# Patient Record
Sex: Female | Born: 1955 | Race: White | Hispanic: No | Marital: Married | Smoking: Current every day smoker
Health system: Southern US, Community
[De-identification: ages and names within clinical notes are randomized; demographics above are authoritative.]

## PROBLEM LIST (undated history)

## (undated) DIAGNOSIS — F419 Anxiety disorder, unspecified: Secondary | ICD-10-CM

## (undated) DIAGNOSIS — I82409 Acute embolism and thrombosis of unspecified deep veins of unspecified lower extremity: Secondary | ICD-10-CM

## (undated) DIAGNOSIS — D696 Thrombocytopenia, unspecified: Secondary | ICD-10-CM

## (undated) DIAGNOSIS — G610 Guillain-Barre syndrome: Secondary | ICD-10-CM

## (undated) HISTORY — PX: TONSILLECTOMY: SUR1361

## (undated) HISTORY — PX: APPENDECTOMY: SHX54

## (undated) HISTORY — PX: PARTIAL HIP ARTHROPLASTY: SHX733

---

## 2004-08-26 ENCOUNTER — Inpatient Hospital Stay (HOSPITAL_COMMUNITY): Admission: RE | Admit: 2004-08-26 | Discharge: 2004-08-29 | Payer: Self-pay | Admitting: Orthopedic Surgery

## 2005-12-01 IMAGING — CR DG HIP COMPLETE 2+V*R*
3 series · 3 of 3 positions shown · non-contrast
Comparison: Prior MRI of the right hip from 07/16/04.

CLINICAL DATA: Right hip pain.  
 RIGHT HIP ? 3 VIEWS:

[t pelvis a.p.]
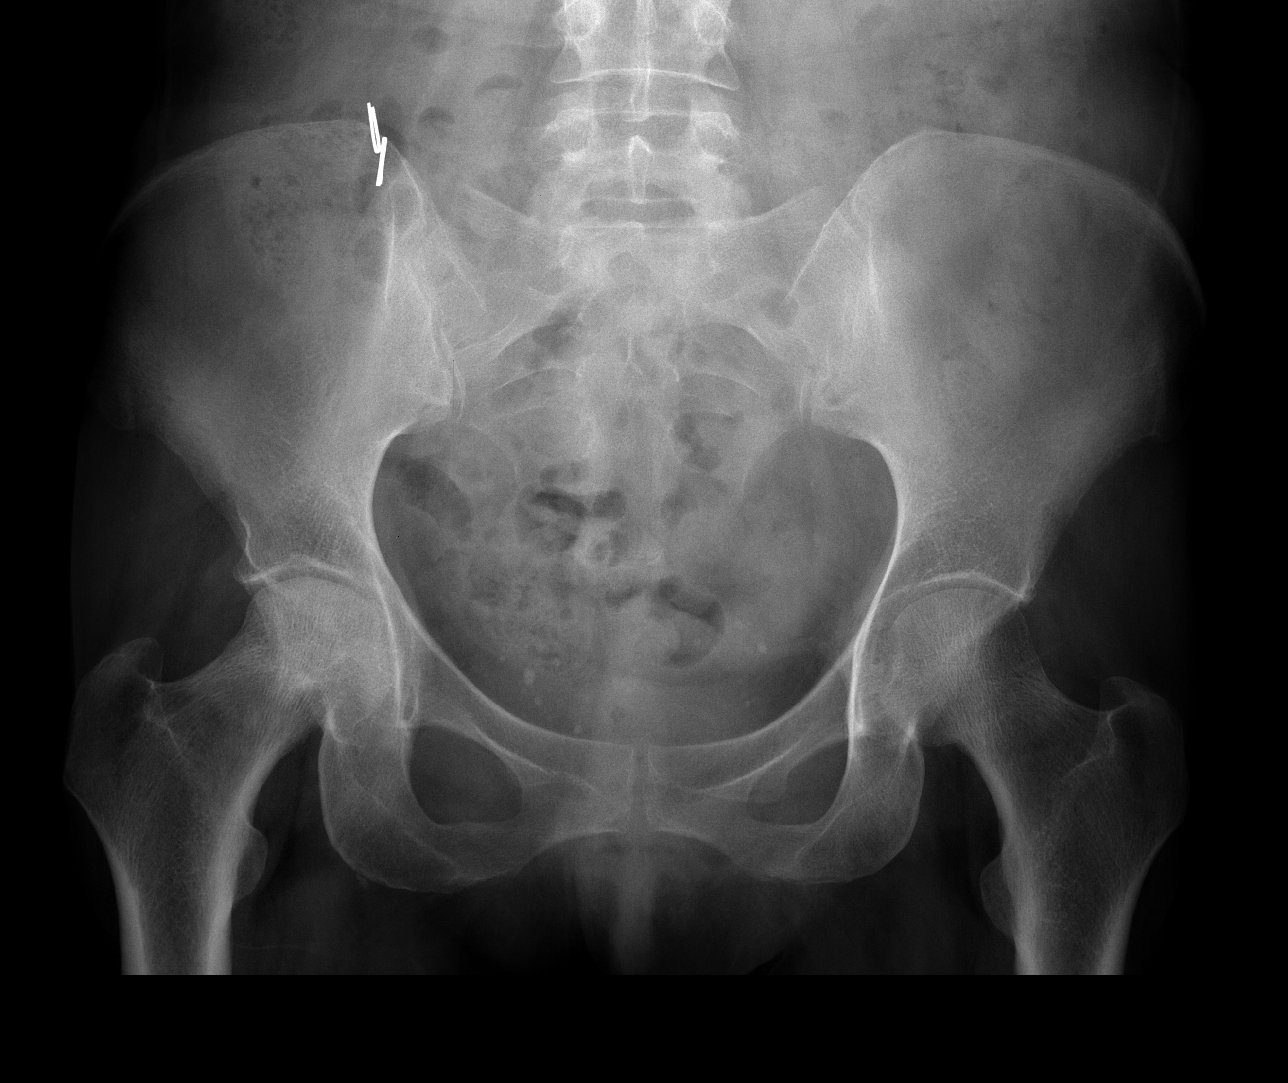

[t hip ap right]
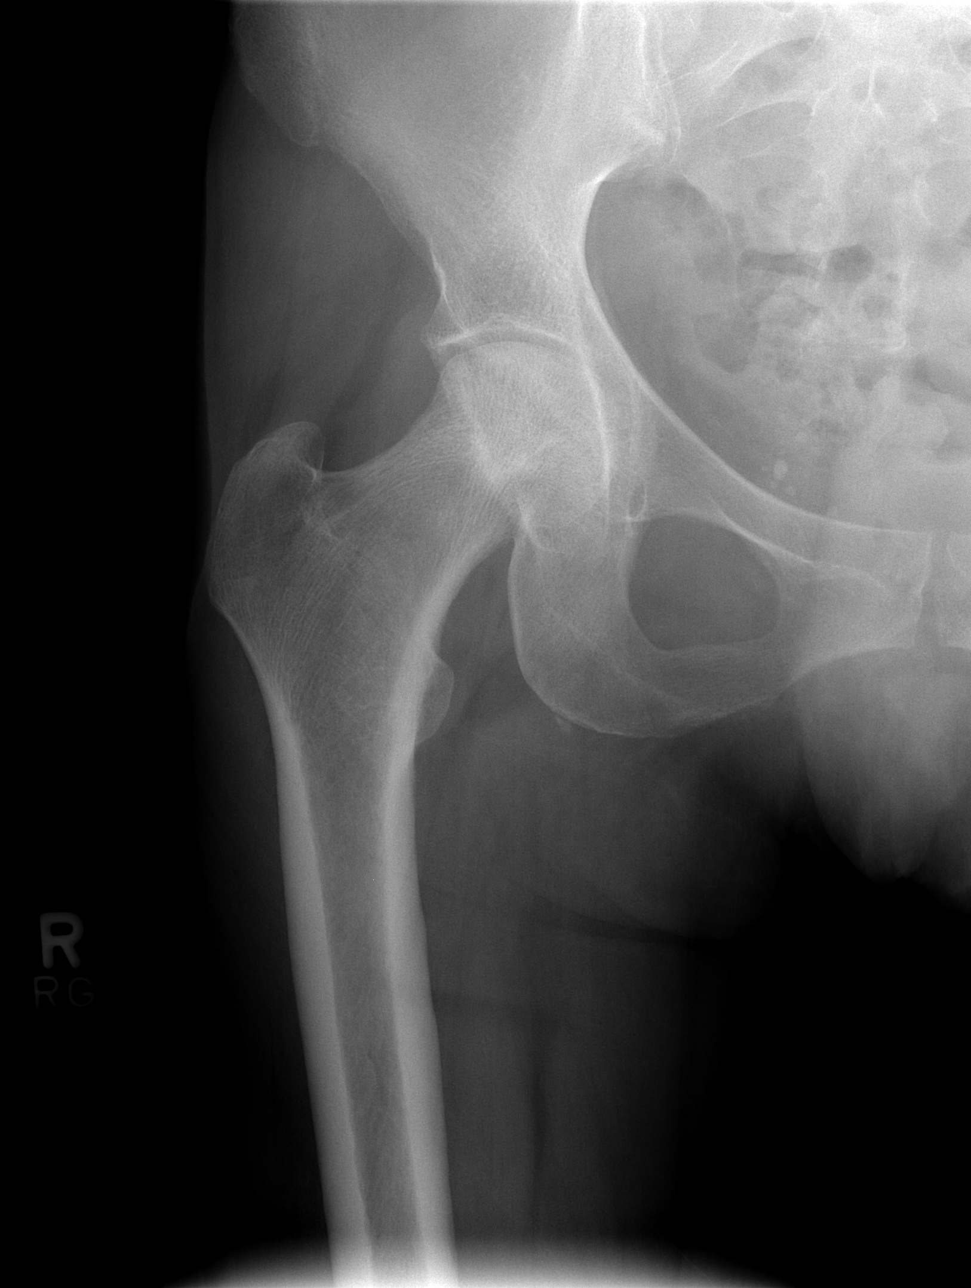

[t hip frog leg right]
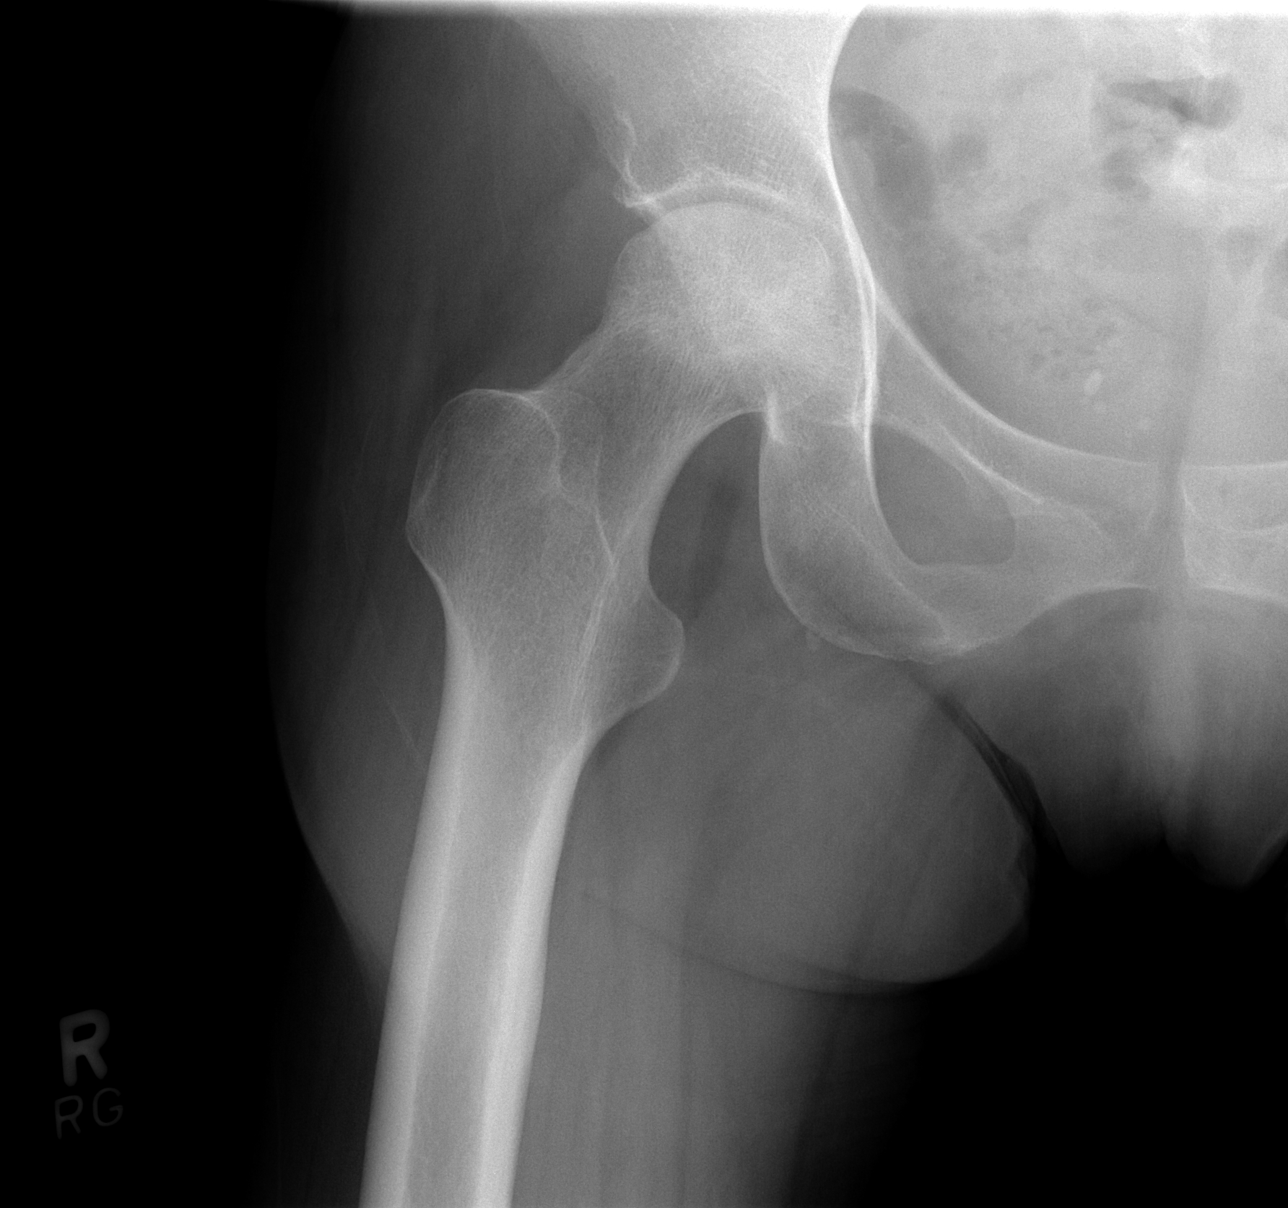

[3 of 3 positions shown; findings below may reference images not displayed]

FINDINGS: The patient has known avascular necrosis based on the recent MRI.  On radiographs today, this manifest only as sclerosis in the right femoral head and neck.  The left-sided avascular necrosis is currently relatively occult.  There is some coarsening of trabeculation in the right femoral head and neck additionally.  There is very slight contour irregularity of the right femoral head, which could be a manifestation of early flattening.
IMPRESSION: Known bilateral avascular necrosis, with radiographic manifestations only on the right side, consisting of sclerosis and trabecular coarsening and potentially very mild flattening.

## 2005-12-05 IMAGING — CR DG PORTABLE PELVIS
1 series · 1 of 1 positions shown · non-contrast
Comparison: none

CLINICAL DATA: Avascular necrosis.  
 PORTABLE PELVIS - 08/26/2004 AT 1977 HOURS:

[view not recorded]
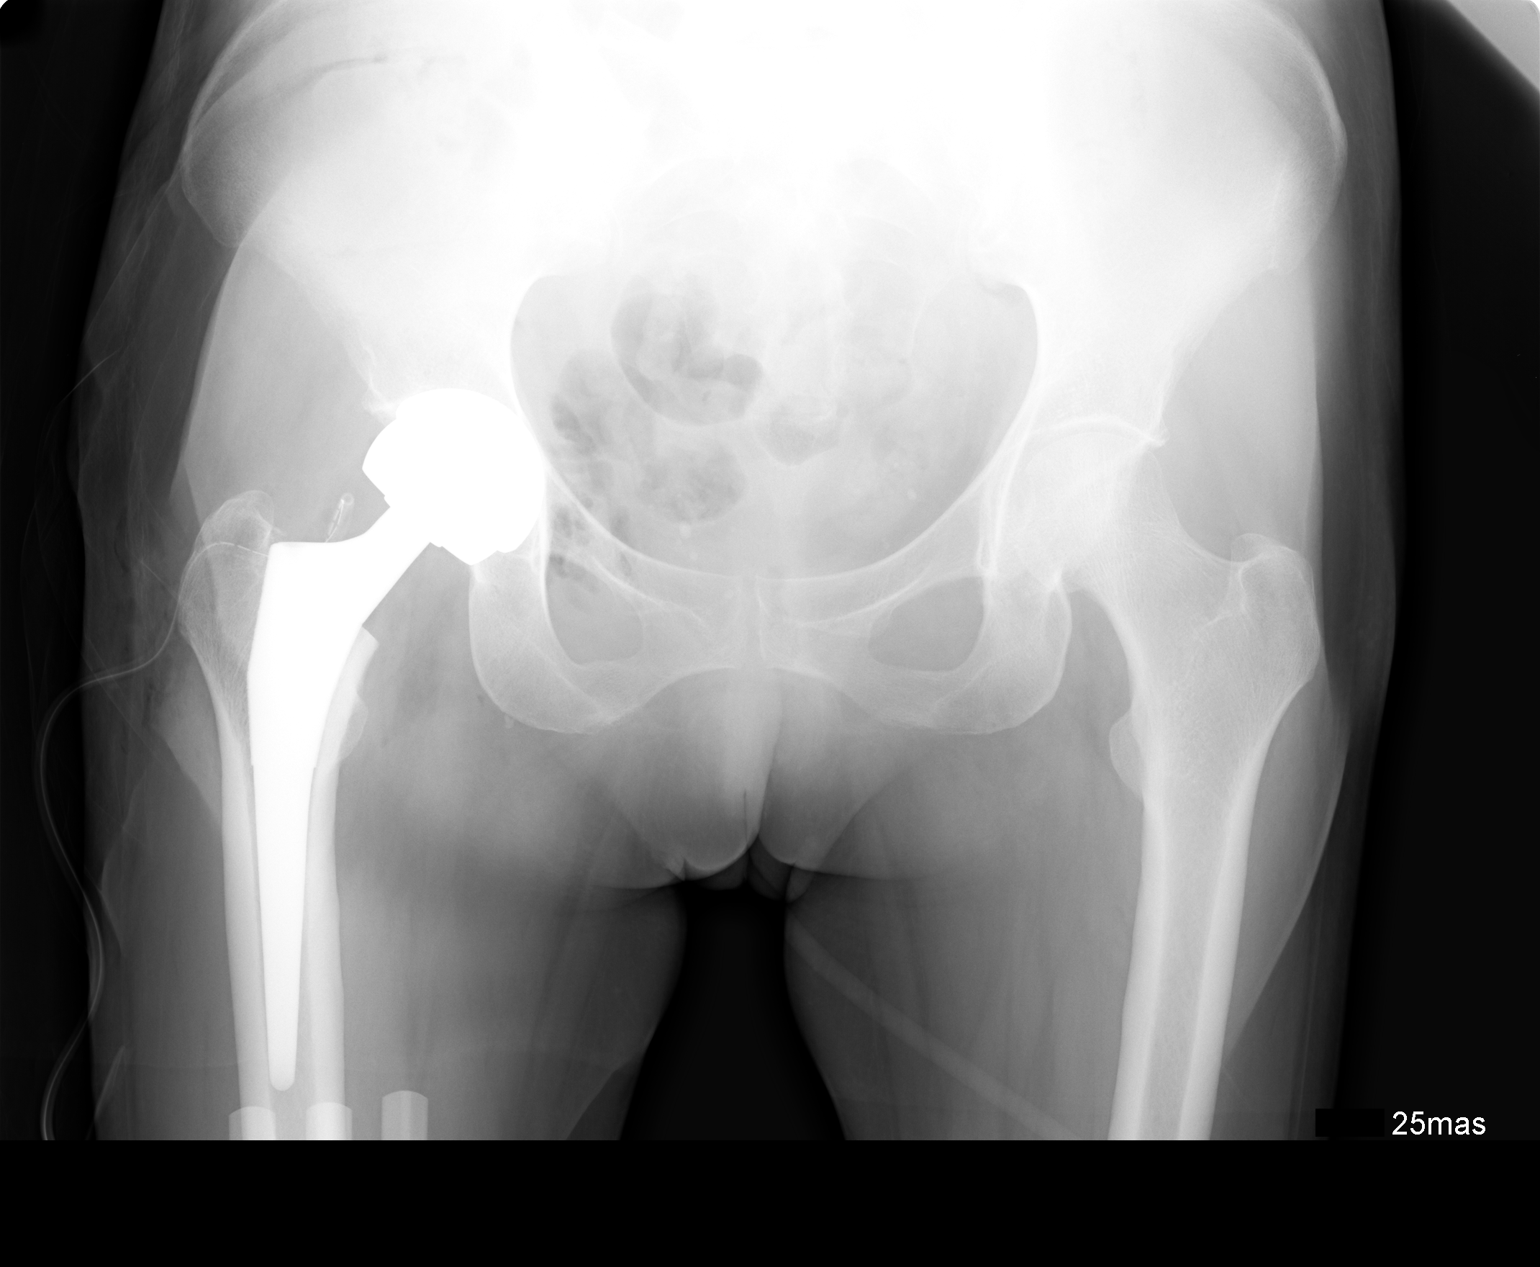

[1 of 1 positions shown; findings below may reference images not displayed]

FINDINGS: A right total hip arthroplasty is present.  There is anatomic alignment of the osseous and prosthetic structures based on one view.  A surgical drain projects over the right femoral neck. No acute fractures or dislocations are seen.
IMPRESSION: Right hip total arthroplasty without complication.

## 2015-06-27 ENCOUNTER — Telehealth: Payer: Self-pay | Admitting: *Deleted

## 2015-06-27 ENCOUNTER — Encounter: Payer: Self-pay | Admitting: *Deleted

## 2015-06-27 ENCOUNTER — Emergency Department (INDEPENDENT_AMBULATORY_CARE_PROVIDER_SITE_OTHER)
Admission: EM | Admit: 2015-06-27 | Discharge: 2015-06-27 | Disposition: A | Discharge status: Home / Self Care | Payer: Medicare HMO | Source: Home / Self Care | Attending: Family Medicine | Admitting: Family Medicine

## 2015-06-27 DIAGNOSIS — K029 Dental caries, unspecified: Secondary | ICD-10-CM | POA: Diagnosis not present

## 2015-06-27 HISTORY — DX: Guillain-Barre syndrome: G61.0

## 2015-06-27 HISTORY — DX: Anxiety disorder, unspecified: F41.9

## 2015-06-27 HISTORY — DX: Thrombocytopenia, unspecified: D69.6

## 2015-06-27 HISTORY — DX: Acute embolism and thrombosis of unspecified deep veins of unspecified lower extremity: I82.409

## 2015-06-27 MED ORDER — HYDROCODONE-ACETAMINOPHEN 5-325 MG PO TABS
1.0000 | ORAL_TABLET | Freq: Four times a day (QID) | ORAL | Status: AC | PRN
Start: 2015-06-27 — End: ?

## 2015-06-27 NOTE — Discharge Instructions (Signed)
Norco/Vicodin (hydrocodone-acetaminophen) is a narcotic pain medication, do not combine these medications with others containing tylenol. While taking, do not drink alcohol, drive, or perform any other activities that requires focus while taking these medications. Do not take while also taking your ativan.  Please be sure to follow up with your dentist tomorrow for further evaluation and treatment of dental pain.   Dental Caries Dental caries is tooth decay. This decay can cause a hole in teeth (cavity) that can get bigger and deeper over time. HOME CARE  Brush and floss your teeth. Do this at least two times a day.  Use a fluoride toothpaste.  Use a mouth rinse if told by your dentist or doctor.  Eat less sugary and starchy foods. Drink less sugary drinks.  Avoid snacking often on sugary and starchy foods. Avoid sipping often on sugary drinks.  Keep regular checkups and cleanings with your dentist.  Use fluoride supplements if told by your dentist or doctor.  Allow fluoride to be applied to teeth if told by your dentist or doctor.   This information is not intended to replace advice given to you by your health care provider. Make sure you discuss any questions you have with your health care provider.   Document Released: 10/15/2007 Document Revised: 01/26/2014 Document Reviewed: 01/08/2012 Elsevier Interactive Patient Education [employer]2016 [employer]

## 2015-06-27 NOTE — ED Notes (Addendum)
Pt [place] apt [place] this [place] to have several teeth pulled. 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 She was unable to get out of the truck and dentist could not help her. They told her to go somewhere for pain medication until she's able to come with someone who can physically bring her in. She has been on Amox for 2 days. We attempted to get dentist office to write RX and put in call to PCP without resolve. Reports tramadol does not work for her

## 2015-06-27 NOTE — ED Notes (Signed)
Callback: Spoke with patient's husband, following up to ensure the numbing in her mouth was wearing off. He reported she was sleeping. Call back with any questions or concerns.

## 2015-06-27 NOTE — ED Provider Notes (Signed)
CSN: [id number]     Arrival date & time 06/27/15  1005 History   First MD Initiated Contact with Patient 06/27/15 1015     Chief Complaint  Patient presents with  . Dental Pain   (Consider location/radiation/quality/duration/timing/severity/associated sxs/prior Treatment) HPI  The pt is a 60yo female with hx of Guillain Barre Syndrome, anxiety, DVT, and thrombocytopenia on wafarin and ativan 1mg  as needed for anxiety, [facility] with c/o severe, 10/10, dental pain due to tooth decay. Currently taking Amoxicillin 500mg  as prescribed by her dentist for current tooth decay.  Pt has been taking tramadol as prescribed by her PCP w/o relief. She was scheduled to have her bottom teeth pulled today by her dentist but due to GBS, needed help getting out of the car but no staff available at the facility to assitant transport of patient. Pt normally goes with her son to appointments but he was unable to go today.  pt [facility] for [facility] management of dental pain.  Pt has not been able to get a hold of her PCP.  Denies fever, n/v/d.     Past Medical History  Diagnosis Date  . GBS (Guillain Barre syndrome) (HCC)   . Anxiety   . DVT (deep venous thrombosis) (HCC)   . Thrombocytopenia [facility](HCC)    Past Surgical History  Procedure Laterality Date  . Appendectomy    . Tonsillectomy    . Partial hip arthroplasty     History reviewed. No pertinent family history. Social History  Substance Use Topics  . Smoking status: Current Every Day Smoker  . Smokeless tobacco: Never Used  . Alcohol Use: Yes   OB History    No data available     Review of Systems  Constitutional: Negative for fever and chills.  HENT: Positive for dental problem. Negative for sore throat, trouble swallowing and voice change.   Gastrointestinal: Negative for nausea and vomiting.  Neurological: Negative for weakness, numbness and headaches.    Allergies  Review of patient's allergies indicates no known  allergies.  Home Medications   Prior to Admission medications   Medication Sig Start Date End Date Taking? Authorizing Provider  amoxicillin (AMOXIL) 500 MG capsule Take 500 mg by mouth 3 (three) times daily.   Yes Historical Provider, MD  LORazepam (ATIVAN) 1 MG tablet Take 1 mg by mouth every 8 (eight) hours.   Yes Historical Provider, MD  primidone (MYSOLINE) 50 MG tablet Take by mouth 4 (four) times daily.   Yes Historical Provider, MD  traMADol (ULTRAM) 50 MG tablet Take by mouth every 6 (six) hours as needed.   Yes Historical Provider, MD  warfarin (COUMADIN) 3 MG tablet Take 3 mg by mouth daily.   Yes Historical Provider, MD  HYDROcodone-acetaminophen (NORCO/VICODIN) 5-325 MG tablet Take 1 tablet by mouth every 6 (six) hours as needed for moderate pain or severe pain. 06/27/15   Junius FinnerErin O'Malley, PA-C   Meds Ordered and Administered this Visit  Medications - No data to display  BP 110/70 mmHg  Pulse 89  Temp(Src) 98.1 F (36.7 C) (Oral)  Resp 16  SpO2 97% No data found.   Physical Exam  Constitutional: She is oriented to person, place, and time. She appears well-developed and well-nourished. She appears distressed.  Pt sitting in wheelchair, tearful.  HENT:  Head: Normocephalic and atraumatic.  Mouth/Throat: Uvula is midline, oropharynx is clear and moist and mucous membranes are normal. No trismus in the jaw. Abnormal dentition. Dental  abscesses and dental caries present. No uvula swelling.  Edentulous on top jaw. Lower jaw, multiple missing teeth with remaining teeth severely decayed. Gingival edema and tenderness. No active bleeding or discharge. No airway involvement.   Eyes: EOM are normal.  Neck: Normal range of motion. Neck supple.  Cardiovascular: Normal rate.   Pulmonary/Chest: Effort normal.  Musculoskeletal: Normal range of motion.  Neurological: She is alert and oriented to person, place, and time.  Skin: Skin is warm and dry.  Psychiatric: She has a normal mood  and affect. Her behavior is normal.  Nursing note and vitals reviewed.   ED Course  .Nerve Block Date/Time: 06/27/2015 10:59 AM Performed by: Junius Finner Authorized by: Donna Christen A Consent: Verbal consent obtained. Risks and benefits: risks, benefits and alternatives were discussed Consent given by: patient Patient understanding: patient states understanding of the procedure being performed Patient consent: the patient's understanding of the procedure matches consent given Site marked: the operative site was marked Required items: required blood products, implants, devices, and special equipment available Patient identity confirmed: verbally with patient Time out: Immediately prior to procedure a "time out" was called to verify the correct patient, procedure, equipment, support staff and site/side marked as required. Indications: pain relief Body area: face/mouth Nerve: inferior alveolar Laterality: right Patient sedated: no Preparation: Patient was prepped and draped in the usual sterile fashion. Patient position: sitting Local anesthetic: lidocaine 1% with epinephrine and bupivacaine 0.5% without epinephrine Anesthetic total: 1.5 ml Outcome: pain improved Patient tolerance: Patient tolerated the procedure well with no immediate complications   (including critical care time)  Labs Review Labs Reviewed - No data to display  Imaging Review No results found.    MDM   1. Pain due to dental caries   2. Dental decay    Pt c/o severe tooth pain. Difficulty getting into dentist this morning and unable to reach her PCP.   Pt tearful. Dental nerve block offered as pt tearful and requesting immediate relief.   Pt reported pain relief after injection. Rx: norco (5 tabs) take 1 every 6 hours only as needed for severe pain, do not combine with ativan or tramadol.  Strongly encouraged f/u with PCP later today or tomorrow morning if possible. Also encouraged f/u with dentist  tomorrow if possible. Encouraged to keep taking Amoxcillin.  Patient verbalized understanding and agreement with treatment plan.    Junius Finner, PA-C 06/27/15 [phone]
